# Patient Record
Sex: Male | Born: 1985 | Hispanic: Yes | Marital: Married | Smoking: Never smoker
Health system: Southern US, Community
[De-identification: ages and names within clinical notes are randomized; demographics above are authoritative.]

---

## 2018-02-10 ENCOUNTER — Emergency Department (HOSPITAL_COMMUNITY): Payer: Self-pay

## 2018-02-10 ENCOUNTER — Other Ambulatory Visit: Payer: Self-pay

## 2018-02-10 ENCOUNTER — Emergency Department (HOSPITAL_COMMUNITY)
Admission: EM | Admit: 2018-02-10 | Discharge: 2018-02-10 | Disposition: A | Discharge status: Home / Self Care | Payer: Self-pay | Attending: Emergency Medicine | Admitting: Emergency Medicine

## 2018-02-10 ENCOUNTER — Encounter (HOSPITAL_COMMUNITY): Payer: Self-pay | Admitting: Obstetrics and Gynecology

## 2018-02-10 DIAGNOSIS — Y92000 Kitchen of unspecified non-institutional (private) residence as  the place of occurrence of the external cause: Secondary | ICD-10-CM | POA: Insufficient documentation

## 2018-02-10 DIAGNOSIS — S61412A Laceration without foreign body of left hand, initial encounter: Secondary | ICD-10-CM | POA: Insufficient documentation

## 2018-02-10 DIAGNOSIS — Y93G1 Activity, food preparation and clean up: Secondary | ICD-10-CM | POA: Insufficient documentation

## 2018-02-10 DIAGNOSIS — Y999 Unspecified external cause status: Secondary | ICD-10-CM | POA: Insufficient documentation

## 2018-02-10 DIAGNOSIS — W260XXA Contact with knife, initial encounter: Secondary | ICD-10-CM | POA: Insufficient documentation

## 2018-02-10 MED ORDER — CEPHALEXIN 500 MG PO CAPS: 500.0000 mg | ORAL_CAPSULE | Freq: Four times a day (QID) | ORAL | 0 refills | Status: AC

## 2018-02-10 MED ORDER — FENTANYL CITRATE (PF) 100 MCG/2ML IJ SOLN: 25.0000 ug | Freq: Once | INTRAMUSCULAR | Status: DC

## 2018-02-10 MED ORDER — FENTANYL CITRATE (PF) 100 MCG/2ML IJ SOLN
50.0000 ug | INTRAMUSCULAR | Status: DC | PRN
  Administered 2018-02-10: 50 ug via NASAL
  Filled 2018-02-10: qty 2

## 2018-02-10 MED ORDER — LIDOCAINE HCL 2 % IJ SOLN
10.0000 mL | Freq: Once | INTRAMUSCULAR | Status: AC
  Administered 2018-02-10: 200 mg via INTRADERMAL
  Filled 2018-02-10: qty 20

## 2018-02-10 NOTE — ED Triage Notes (Signed)
Pt reports he cut himself with a box cutter. Pt has a deep laceration to the left hand, bleeding at this time is not controlled, pressure applied.  Pt reports he was cutting some plastic and slipped and cut himself. Pt reports he had a tetanus shot 1 year ago when he had surgery

## 2018-02-10 NOTE — Discharge Instructions (Addendum)
You were given a prescription for antibiotics. Please take the antibiotic prescription fully.   You need to follow-up with the hand doctor as provided on your discharge instructions.  Please return for suture removal in 14 days.  Please return to the emergency room immediately if you experience any new or worsening symptoms or any symptoms that indicate worsening infection such as fevers, increased redness/swelling/pain, warmth, or drainage from the affected area.

## 2018-02-10 NOTE — ED Provider Notes (Signed)
[name] Provider Note   CSN: [id number] Arrival date & time: 02/10/18  1453     History   Chief Complaint Chief Complaint  Patient presents with  . Laceration    L Hand    HPI Caleb Gates is a 32 y.o. male.  HPI   Patient is a 32 year old Spanish-speaking male who presents emergency department today with a laceration to the left hand that occurred just prior to arrival.  Patient states that he was cutting something prior to arrival when his hand slipped and he ended up cutting himself on the left thenar eminence.  Had immediate onset of sudden pain.  Denies any numbness to the tips of the finger.  States he has full range of motion of all the fingers and left hand.  States his Tdap was updated 1 year ago.  Offered to use a translator multiple times however patient declines. He states he has family at bedside that he would like to translate for him.  No past medical history on file.  There are no active problems to display for this patient.   Home Medications    Prior to Admission medications   Medication Sig Start Date End Date Taking? Authorizing Provider  cephALEXin (KEFLEX) 500 MG capsule Take 1 capsule (500 mg total) by mouth 4 (four) times daily for 7 days. 02/10/18 02/17/18  Tidus Upchurch S, PA-C    Family History No family history on file.  Social History Social History   Tobacco Use  . Smoking status: Never Smoker  Substance Use Topics  . Alcohol use: Not Currently  . Drug use: Not Currently     Allergies   Patient has no known allergies.   Review of Systems Review of Systems  Constitutional: Negative for fever.  Musculoskeletal:       Left hand pain  Skin: Positive for wound.  Neurological: Negative for weakness and numbness.     Physical Exam Updated Vital Signs BP 140/87 (BP Location: Right Arm)   Pulse 80   Resp 16   Ht 5\' 6"  (1.676 m)   Wt 90.7 kg   SpO2 97%   BMI 32.28 kg/m   Physical  Exam  Constitutional: He is oriented to person, place, and time. He appears well-developed and well-nourished. He appears distressed.  Eyes: Conjunctivae are normal.  Cardiovascular: Normal rate.  Pulmonary/Chest: Effort normal.  Musculoskeletal:  2.5 cm linear laceration to the left thenar eminence.  No arterial bleeding noted.  Patient is able to flex the left thumb at the IP, MCP and CMC joints.  Extension is intact as well.  Grip strength is intact, pincer strength is intact.  No sensory changes to the distal aspect of the left thumb.  Brisk cap refill to all fingers on the left hand.  Neurological: He is alert and oriented to person, place, and time.  Skin: Skin is warm and dry.     ED Treatments / Results  Labs (all labs ordered are listed, but only abnormal results are displayed) Labs Reviewed - No data to display  EKG None  Radiology Dg Hand Complete Left  Result Date: 02/10/2018 CLINICAL DATA:  Cut hand with box cutter.  Initial encounter EXAM: LEFT HAND - COMPLETE 3+ VIEW COMPARISON:  None. FINDINGS: Soft tissue swelling and hematoma is present at thenar eminence. Laceration is obscured by bandage. There is no underlying fracture. No radiopaque foreign body is present. And is otherwise normal. Wrist is within normal limits. IMPRESSION: 1.  Soft tissue laceration and hematoma to the thenar eminence. 2. No acute osseous abnormality. Electronically Signed   By: Marin Roberts M.D.   On: 02/10/2018 16:45    Procedures .Marland KitchenLaceration Repair Date/Time: 02/10/2018 6:40 PM Performed by: Karrie Meres, PA-C Authorized by: Karrie Meres, PA-C   Consent:    Consent obtained:  Verbal   Consent given by:  Patient   Risks discussed:  Infection, pain and retained foreign body   Alternatives discussed:  No treatment Anesthesia (see MAR for exact dosages):    Anesthesia method:  Local infiltration   Local anesthetic:  Lidocaine 2% w/o epi Laceration details:    Location:   Hand   Hand location:  L palm   Length (cm):  2.5 Pre-procedure details:    Preparation:  Patient was prepped and draped in usual sterile fashion and imaging obtained to evaluate for foreign bodies Exploration:    Hemostasis achieved with:  Direct pressure   Wound exploration: wound explored through full range of motion and entire depth of wound probed and visualized   Treatment:    Area cleansed with:  Betadine and saline   Amount of cleaning:  Extensive   Irrigation solution:  Sterile saline   Irrigation volume:  1L   Irrigation method:  Pressure wash   Visualized foreign bodies/material removed: no   Skin repair:    Repair method:  Sutures   Suture size:  5-0   Suture material:  Prolene   Number of sutures:  9 Approximation:    Approximation:  Close Post-procedure details:    Dressing:  Antibiotic ointment, sterile dressing and bulky dressing   Patient tolerance of procedure:  Tolerated well, no immediate complications   (including critical care time)  Medications Ordered in ED Medications  fentaNYL (SUBLIMAZE) injection 50 mcg (50 mcg Nasal Given 02/10/18 1540)  lidocaine (XYLOCAINE) 2 % (with pres) injection 200 mg (has no administration in time range)     Initial Impression / Assessment and Plan / ED Course  I have reviewed the triage vital signs and the nursing notes.  Pertinent labs & imaging results that were available during my care of the patient were reviewed by me and considered in my medical decision making (see chart for details).     Final Clinical Impressions(s) / ED Diagnoses   Final diagnoses:  Laceration of left hand without foreign body, initial encounter   Patient presenting with laceration to the left thenar eminence.  X-ray of the left hand completed with no evidence of bony involvement or foreign body.  Imaging personally reviewed by myself.  Pressure irrigation performed. Wound explored and base of wound visualized in a bloodless field without  evidence of foreign body.  Laceration occurred < 8 hours prior to repair which was well tolerated. Tdap UTD.  Pt has no comorbidities to effect normal wound healing. Pt discharged with antibiotics to prevent infection. Discussed suture home care with patient and answered questions. Pt to follow-up for wound check and suture removal in 14 days; they are to return to the ED sooner for signs of infection. Will have pt f/u with hand surgery. Pt is hemodynamically stable with no complaints prior to dc.    ED Discharge Orders         Ordered    cephALEXin (KEFLEX) 500 MG capsule  4 times daily     02/10/18 1844           Karrie Meres, PA-C 02/10/18 1847  Rolan BuccoBelfi, Melanie, MD 02/10/18 [phone]

## 2019-05-28 IMAGING — CR DG HAND COMPLETE 3+V*L*
3 series · 3 of 3 positions shown · non-contrast
Comparison: None.

CLINICAL DATA: Cut hand with box cutter.  Initial encounter

EXAM:
LEFT HAND - COMPLETE 3+ VIEW

[x hand pa left]
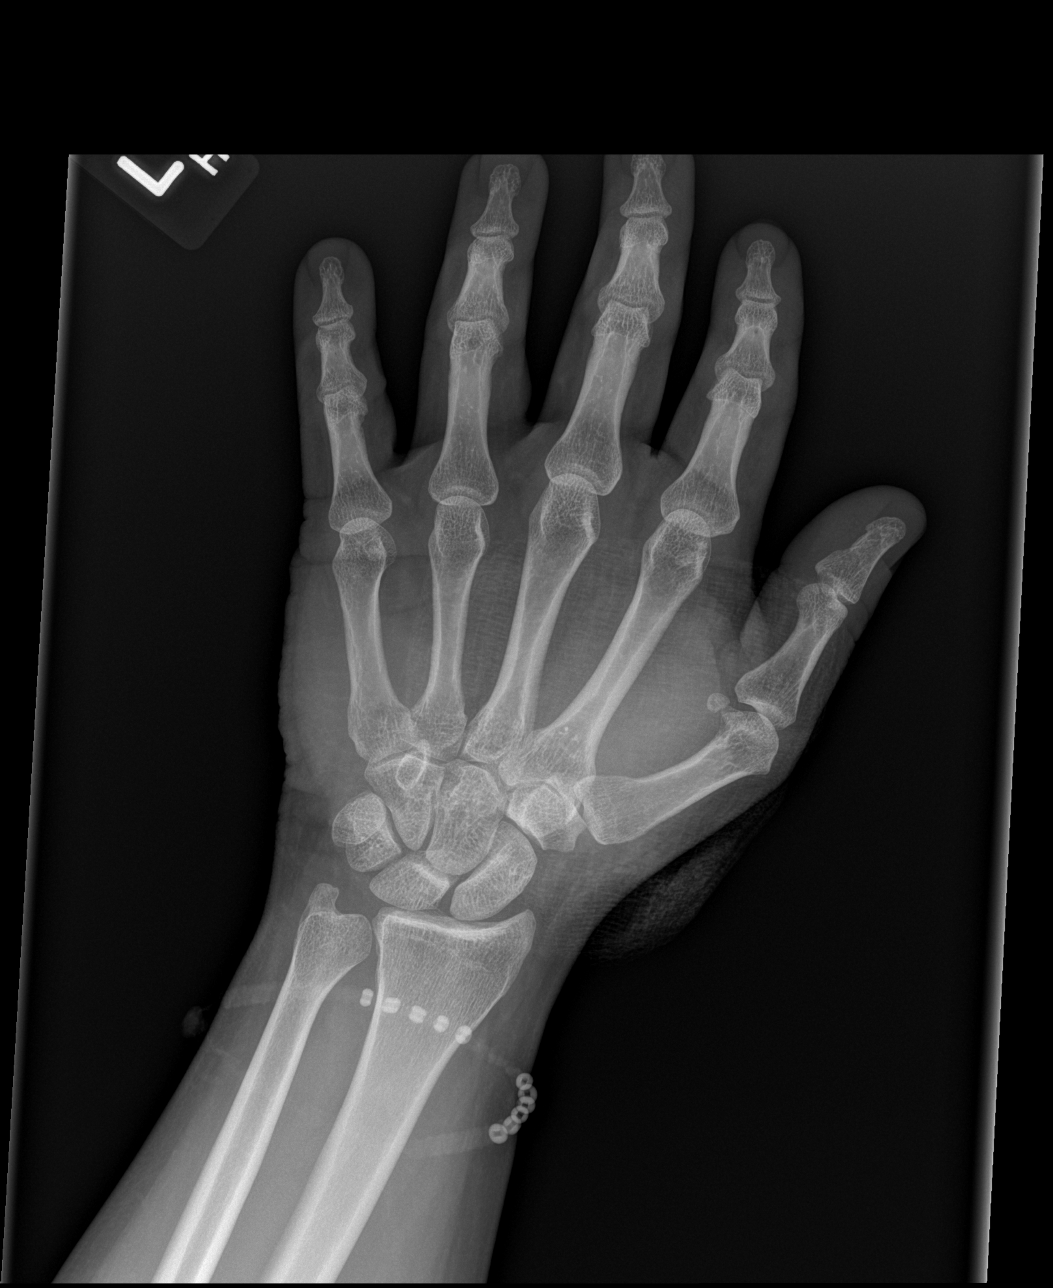

[x hand obl left]
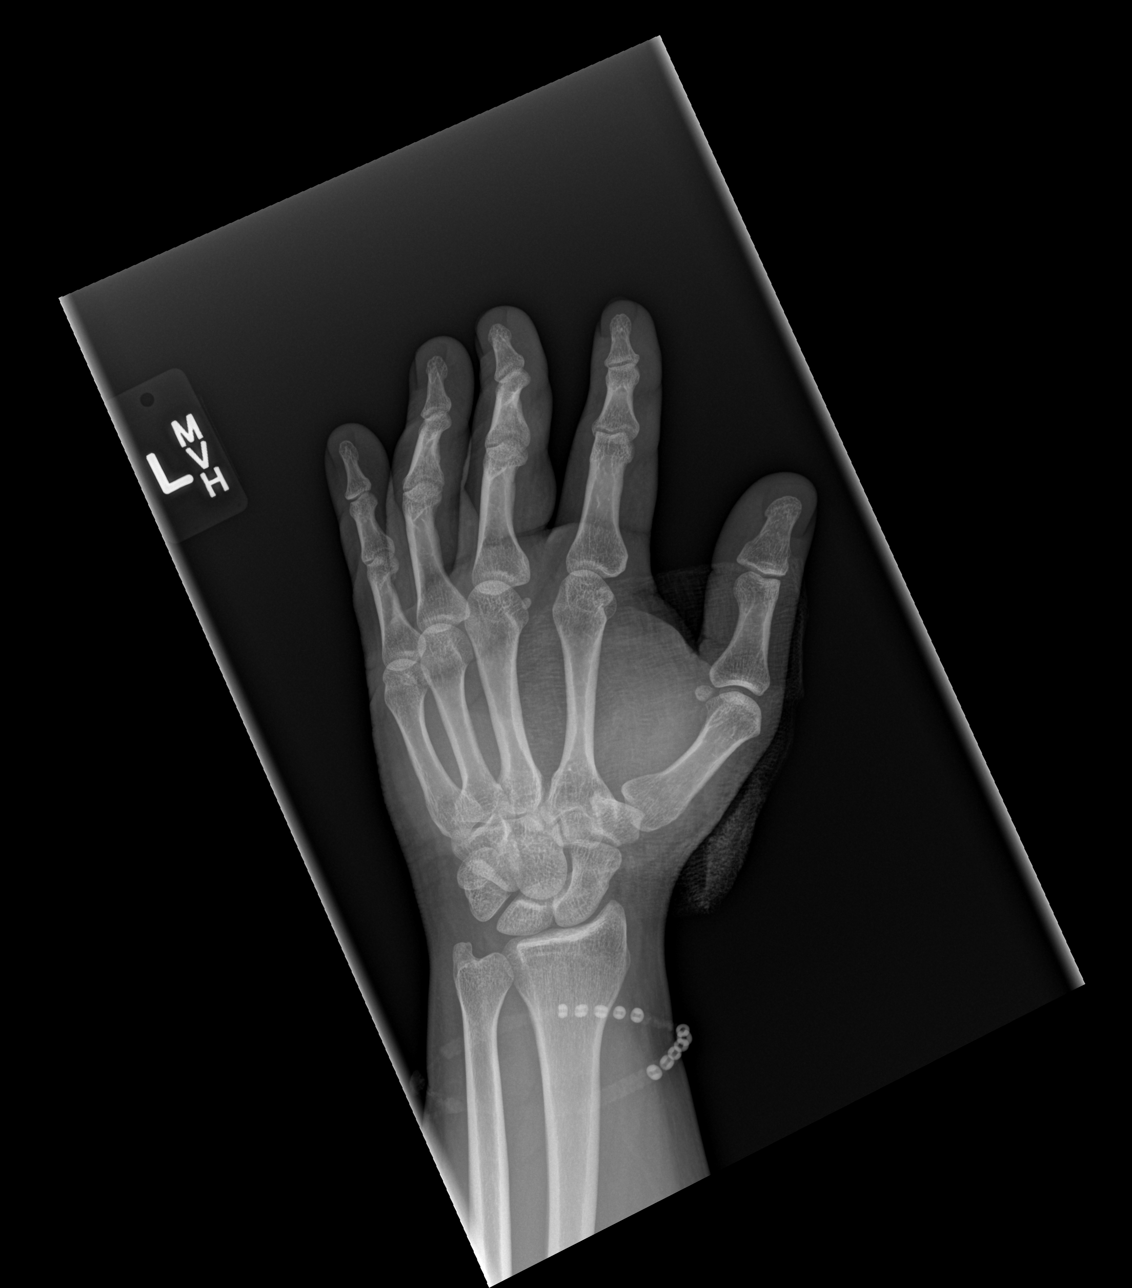

[x hand lat left]
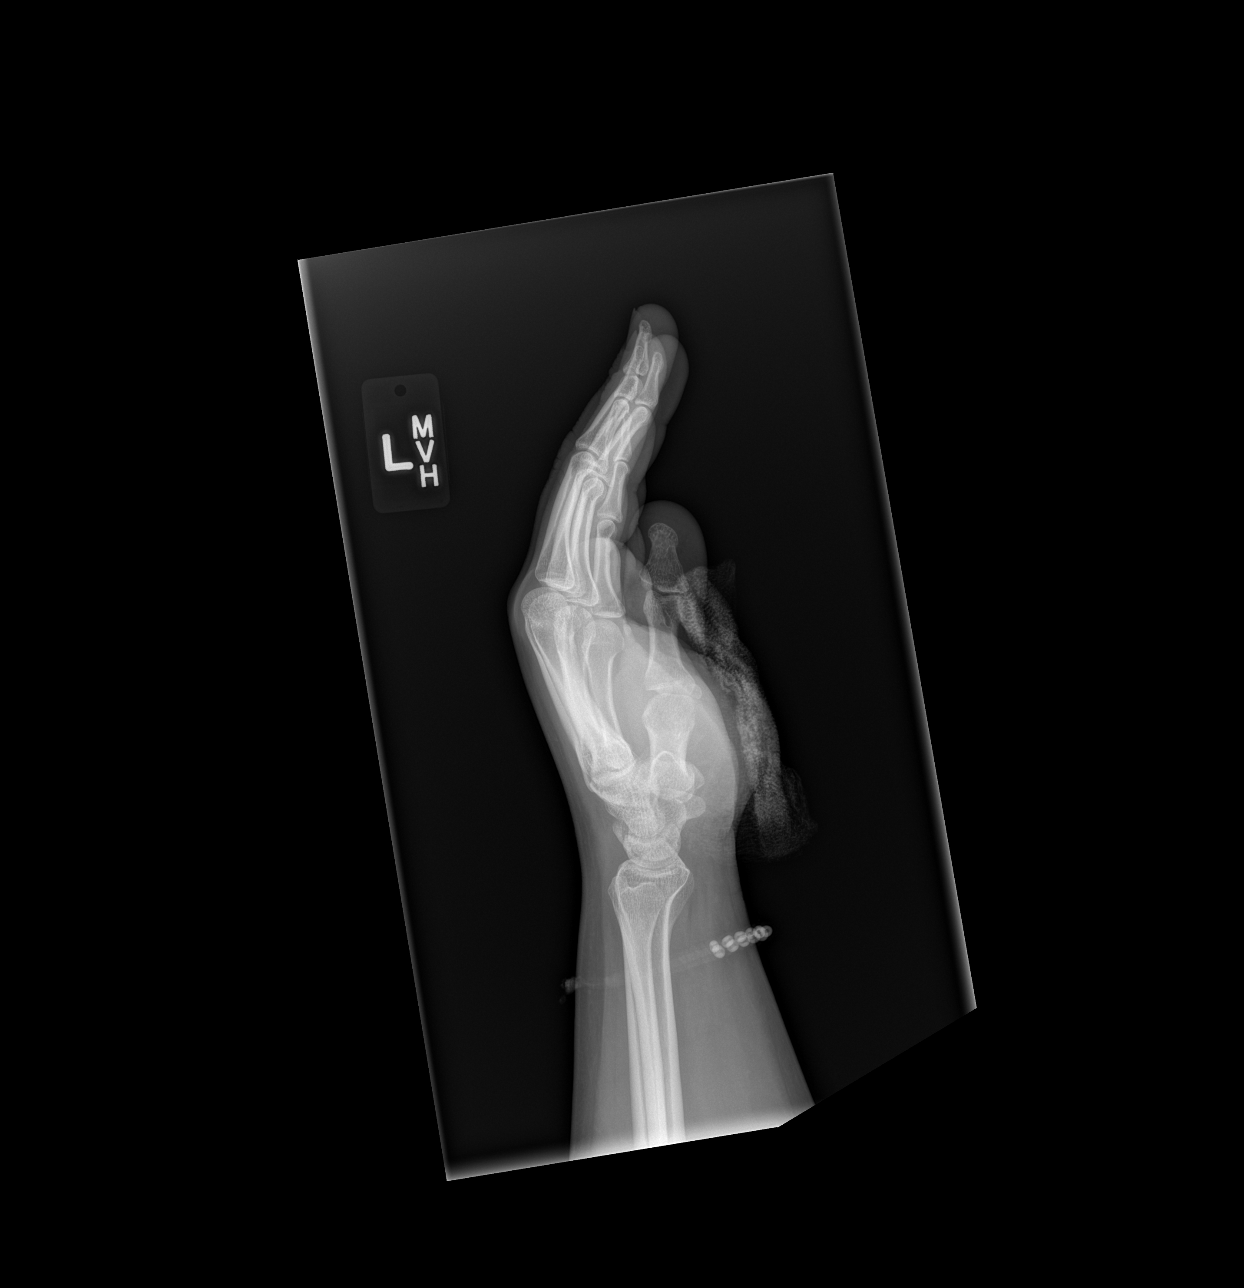

[3 of 3 positions shown; findings below may reference images not displayed]

FINDINGS: Soft tissue swelling and hematoma is present at thenar eminence.
Laceration is obscured by bandage. There is no underlying fracture.
No radiopaque foreign body is present. And is otherwise normal.
Wrist is within normal limits.
IMPRESSION: 1. Soft tissue laceration and hematoma to the thenar eminence.
2. No acute osseous abnormality.
